# Patient Record
Sex: Female | Born: 1935 | Race: White | Hispanic: No | Marital: Married
Health system: Southern US, Community
[De-identification: ages and names within clinical notes are randomized; demographics above are authoritative.]

---

## 1997-11-10 ENCOUNTER — Ambulatory Visit: Admission: RE | Admit: 1997-11-10 | Discharge: 1997-11-10 | Payer: Self-pay | Admitting: Internal Medicine

## 2001-02-11 ENCOUNTER — Other Ambulatory Visit: Admission: RE | Admit: 2001-02-11 | Discharge: 2001-02-11 | Payer: Self-pay | Admitting: *Deleted

## 2003-06-23 ENCOUNTER — Inpatient Hospital Stay (HOSPITAL_COMMUNITY): Admission: RE | Admit: 2003-06-23 | Discharge: 2003-06-30 | Payer: Self-pay | Admitting: General Surgery

## 2003-06-23 ENCOUNTER — Encounter (INDEPENDENT_AMBULATORY_CARE_PROVIDER_SITE_OTHER): Payer: Self-pay | Admitting: Specialist

## 2004-08-05 ENCOUNTER — Ambulatory Visit: Payer: Self-pay | Admitting: Internal Medicine

## 2004-08-28 ENCOUNTER — Ambulatory Visit: Payer: Self-pay | Admitting: Internal Medicine

## 2004-10-21 ENCOUNTER — Other Ambulatory Visit: Admission: RE | Admit: 2004-10-21 | Discharge: 2004-10-21 | Payer: Self-pay | Admitting: Obstetrics and Gynecology

## 2005-06-25 ENCOUNTER — Encounter: Admission: RE | Admit: 2005-06-25 | Discharge: 2005-06-25 | Payer: Self-pay | Admitting: Internal Medicine

## 2005-07-12 IMAGING — CR DG ABDOMEN 2V
2 series · 2 of 2 positions shown · non-contrast
Comparison: 06/26/03.

CLINICAL DATA: Villous adenoma.  Small bowel ileus.
 ABDOMEN, TWO VIEWS 06/27/03

[view not recorded (1 of 2)]
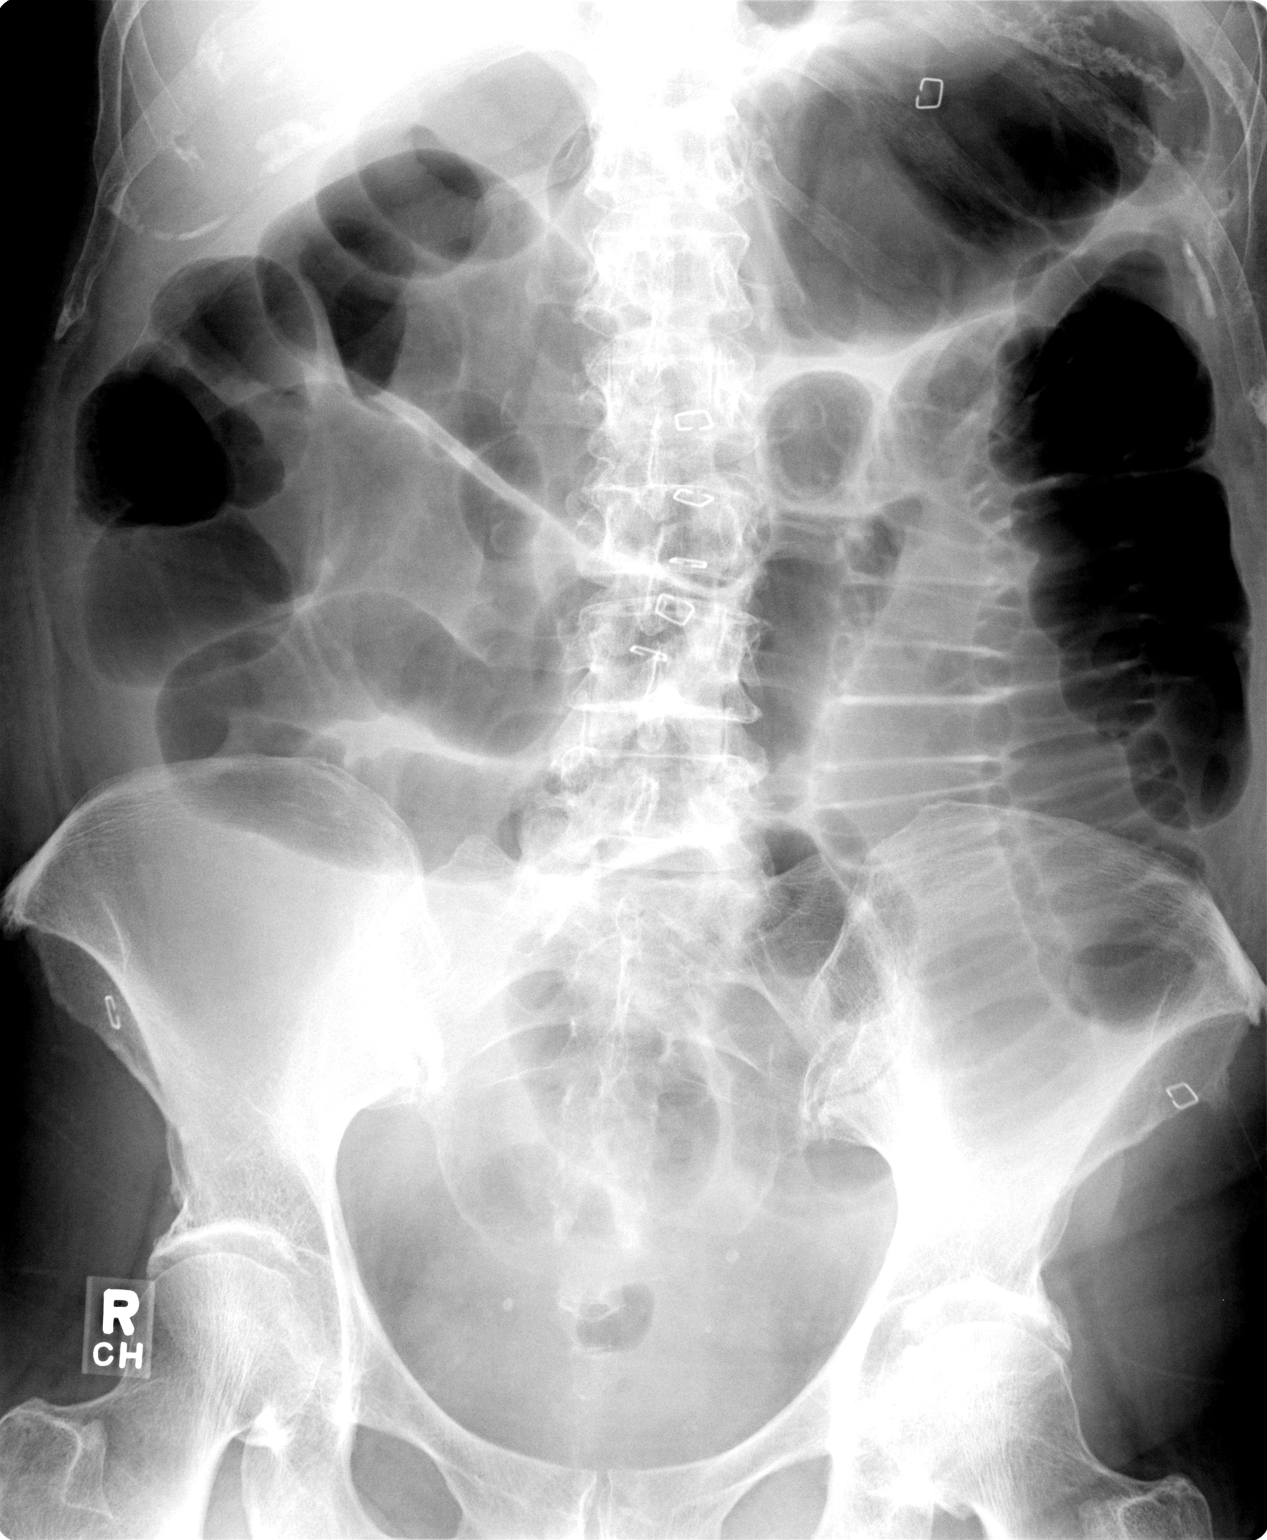

[view not recorded (2 of 2)]
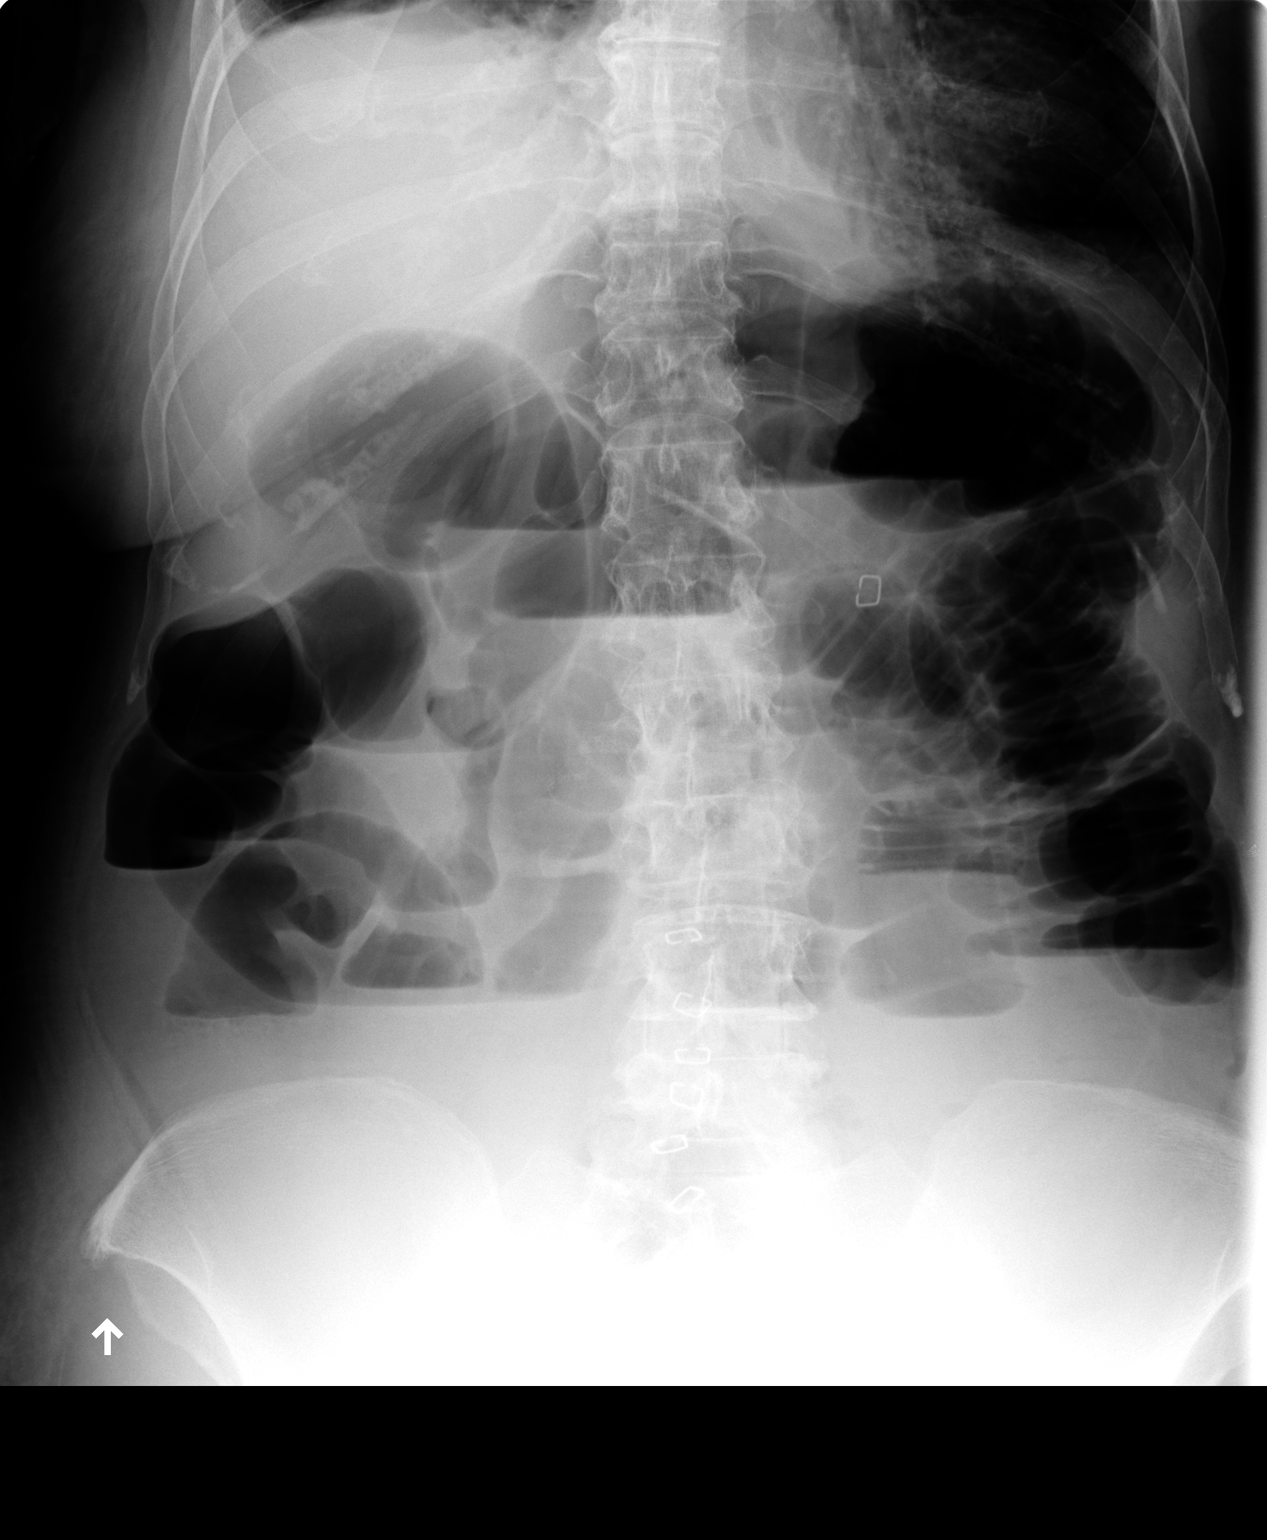

[2 of 2 positions shown; findings below may reference images not displayed]

Again noted is diffuse bowel dilatation involving both small bowel and colon, predominantly up to the splenic flexure.  The degree of dilatation is stable to slightly increased since the prior study.  It is difficult to completely exclude obstruction. 
 IMPRESSION
 Continued small bowel and colonic dilatation, stable to slightly worsened.  Difficult to exclude obstruction.

## 2005-10-15 ENCOUNTER — Encounter: Admission: RE | Admit: 2005-10-15 | Discharge: 2005-10-15 | Payer: Self-pay | Admitting: Obstetrics and Gynecology

## 2005-10-21 ENCOUNTER — Ambulatory Visit: Payer: Self-pay | Admitting: Internal Medicine

## 2006-01-15 ENCOUNTER — Ambulatory Visit: Admission: RE | Admit: 2006-01-15 | Discharge: 2006-05-13 | Payer: Self-pay | Admitting: *Deleted

## 2006-04-10 ENCOUNTER — Ambulatory Visit: Admission: RE | Admit: 2006-04-10 | Discharge: 2006-05-13 | Payer: Self-pay | Admitting: *Deleted

## 2007-09-07 ENCOUNTER — Ambulatory Visit: Payer: Self-pay | Admitting: Internal Medicine

## 2007-09-21 ENCOUNTER — Ambulatory Visit: Payer: Self-pay | Admitting: Internal Medicine

## 2008-07-21 ENCOUNTER — Encounter: Admission: RE | Admit: 2008-07-21 | Discharge: 2008-07-21 | Payer: Self-pay

## 2009-07-17 ENCOUNTER — Encounter (HOSPITAL_COMMUNITY): Admission: RE | Admit: 2009-07-17 | Discharge: 2009-09-27 | Payer: Self-pay | Admitting: Internal Medicine

## 2009-10-14 ENCOUNTER — Encounter (HOSPITAL_COMMUNITY)
Admission: AD | Admit: 2009-10-14 | Discharge: 2010-01-04 | Payer: Self-pay | Source: Home / Self Care | Attending: Internal Medicine | Admitting: Internal Medicine

## 2009-12-04 ENCOUNTER — Emergency Department (HOSPITAL_COMMUNITY): Admission: EM | Admit: 2009-12-04 | Discharge: 2009-12-04 | Payer: Self-pay | Admitting: Emergency Medicine

## 2009-12-13 ENCOUNTER — Encounter (HOSPITAL_COMMUNITY)
Admission: RE | Admit: 2009-12-13 | Discharge: 2010-02-05 | Payer: Self-pay | Source: Home / Self Care | Attending: Internal Medicine | Admitting: Internal Medicine

## 2010-03-19 LAB — URINALYSIS, MICROSCOPIC ONLY
Bilirubin Urine: NEGATIVE
Glucose, UA: NEGATIVE mg/dL
Ketones, ur: NEGATIVE mg/dL
pH: 6.5 (ref 5.0–8.0)

## 2010-03-19 LAB — CROSSMATCH

## 2010-03-19 LAB — TRANSFUSION REACTION

## 2010-03-20 LAB — CROSSMATCH: ABO/RH(D): O POS

## 2010-03-22 LAB — CROSSMATCH

## 2010-03-24 LAB — CROSSMATCH: Antibody Screen: NEGATIVE

## 2010-03-24 LAB — ABO/RH: ABO/RH(D): O POS

## 2010-04-07 DEATH — deceased

## 2010-05-24 NOTE — Op Note (Signed)
NAME:  Marissa Dean, Marissa Dean                          ACCOUNT NO.:  [id number]   MEDICAL RECORD NO.:  [id number]                   PATIENT TYPE:  INP   LOCATION:  0003                                 FACILITY:  [facility]   PHYSICIAN:  Sharlet Salina T. Hoxworth, M.D.          DATE OF BIRTH:  [birth year]   DATE OF PROCEDURE:  06/23/2003  DATE OF DISCHARGE:                                 OPERATIVE REPORT   PREOPERATIVE DIAGNOSIS:  Villous adenoma of the right colon.   POSTOPERATIVE DIAGNOSIS:  Villous adenoma of the right colon.   SURGICAL PROCEDURE:  Laparoscopic-assisted right hemicolectomy.   SURGEON:  Dr. Johna Sheriff   ASSISTANT:  Dr. Jerelene Redden   ANESTHESIA:  General.   BRIEF HISTORY:  Honore Wipperfurth is a 77 year old white female, who on a recent  routine screening colonoscopy was found to have a large, flat polypoid  lesion involving the proximal transverse colon, hepatic flexure area.  Biopsies revealed villous adenoma.  Partial colectomy has been recommended  and accepted.  The nature of the procedure, indications, risks of bleeding,  infection, possible need for open procedure were discussed and understood.  She is now brought to the operating room for this procedure.   DESCRIPTION OF OPERATION:  The patient brought to the operating room, placed  in supine position on the operating table, and general endotracheal  anesthesia was induced.  She had received mechanical antibiotic bowel prep  at home.  Antibiotics were given intravenously.  The abdomen was widely  sterilely prepped and draped.  Access was obtained through a 1 cm incision  just above the umbilicus, dissection carried down to the midline fascia  which was incised for 1 cm.  The peritoneum entered under direct vision.  The Hasson trocar was placed through a mattress suture and pneumoperitoneum  established.  Laparoscopy revealed fairly extensive omental adhesions into  the pelvis and anterior abdominal wall from previous  hysterectomy.  Then 5  mm trocars were placed in the left epigastrium and left lower quadrant.  Working through these sites with the harmonic scalpel, the omental adhesions  were completely lysed from the anterior abdominal wall and the omentum  mobilized above the pelvis.  The cecum, right colon, transverse colon  appeared normal.  The area had been reportedly tattooed, but there was no  evidence of this externally.  An additional 5 mm port was placed in the  right lower quadrant.  The right colon was then extensively mobilized,  dividing peritoneal attachments lateral to the cecum and right colon,  mobilizing this up out of the retroperitoneum.  The terminal ileum was  mobilized as well.  The lesser sac was entered at the lesser omentum in the  mid transverse colon.  Lesser omentum was then divided back toward the  hepatic flexure, and then the hepatic flexure was completely mobilized,  dividing the hepatocolic ligament with the harmonic scalpel.  Blunt  dissection was further used to  mobilize the entire right colon completely  back toward the midline.  After full mobilization, the Hasson trocar was  removed, and the mid abdomen incision was extended to 7 cm in length  periumbilically, dissection carried down through subcutaneous tissue and  fascia with cautery.  The protractor wound protector was placed.  The  terminal ileum, right colon, and transverse colon all then easily came up  out through this incision with very nice exposure of the mesentery and very  good mobility.  There was a mass palpable in the proximal transverse colon.  A resection 7-8 cm distal to this was planned in the mid transverse colon.  The omentum was divided at this point to take some with the specimen, tying  with 2-0 silk ties.  The mesentery of the terminal ileum, cecum, right  colon, proximal, and transverse colon was then sequentially divided between  clamps and tied with 2-0 silk ties, an additional suture  ligature or extra  tie placed for larger vessels.  After complete division of the mesentery, a  functional end-to-end anastomosis was created between the terminal ileum and  transverse colon with a single firing of the EndoGIA 75 mm stapler.  The  common enterotomy was closed and the specimen removed with a single firing  of the TA 60 stapler.  The specimen was sent for gross exam which reportedly  contained a 7 cm villous tumor with margins grossly widely negative.  The  mesenteric defect was closed with interrupted 2-0 silks.  The bowel was  returned to its anatomic position in the abdomen.  The midline wound was  closed with running #1 PDS, beginning at either end of the incision and tied  centrally.  CO2 pressure was then applied and the 5 mm scope placed.  The  abdomen was irrigated and complete hemostasis assured.  Trocars were then  removed and all CO2 evacuated from the peritoneal cavity.  Skin incisions  were closed with staples.  Sponge, needle, and instrument counts were  correct.  Dry sterile dressings were applied and the patient taken to  recovery in good condition.                                               Lorne Skeens. Hoxworth, M.D.    Tory Emerald  D:  06/23/2003  T:  06/23/2003  Job:  [id number]

## 2010-05-24 NOTE — Assessment & Plan Note (Signed)
[facility]                           GASTROENTEROLOGY OFFICE NOTE   Marissa Dean, Marissa Dean                       MRN:          [id number]  DATE:10/21/2005                            DOB:          1928-07-01    REFERRING PHYSICIAN:  Artist Pais, M.D.   REASON FOR CONSULTATION:  Recently diagnosed clear cell carcinoma and  abnormal CT scan.   HISTORY:  This is a 77 year old white female with a history of hypertension,  large villous adenoma of the proximal colon status post right hemicolectomy  in 2005, and left breast cancer who was referred through the courtesy of Dr.  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  As mentioned, the patient underwent  right hemicolectomy for a large villous adenoma of the proximal colon.  This  was found to have high grade dysplasia.  Follow-up surveillance colonoscopy  on August 28, 2004, was normal postoperatively.  Follow up in 3 years  recommended.  The patient did well until recent months when she has had some  intermittent lower abdominal discomfort.  She saw Dr. Thomasena Edis in June with  episodic vaginal bleeding and again this month with complaints of vaginal  bleeding and vaginal odor.  Two small lesions of the vagina were noted, one  of which was fulgurated and the other friable and biopsied.  This was  performed on October 09, 2005, and returned showing vaginal mucosa with clear  cell adenocarcinoma.  I spoke to the pathologist, Marissa Dean. Marissa Dean, M.D.  this afternoon.  He felt this was likely metastatic and of gynecologic  origin.  He did not feel this was typical for renal cell cancer and would be  extremely inconsistent with a primary GI cancer.  The patient did undergo a  CT scan of the abdomen and pelvis on October 15, 2005.  There was no  evidence of primary malignancy within the abdomen.  The most striking  finding, however, was that of a  10.9 cm partially cystic, partially solid  mass that was felt most likely to arise from the right ovary.  The mass was  inseparable from the vaginal cuff.  There was mild dilation of the right  ureter.  The mass was adjacent to the sigmoid colon, though, without  definite involvement.  The patient is accompanied today by her husband and  aside from some lower abdominal and pelvic discomfort is doing well.  She  denies nausea and vomiting, change in appetite, weight loss, change in bowel  habits, or rectal bleeding.   ALLERGIES:  No known drug allergies.   CURRENT MEDICATIONS:  1. Zestoretic unspecified dosage once daily.  2. Fosamax unspecified dosage once weekly.  3. Aspirin 81 mg daily.   PAST MEDICAL HISTORY:  1. Hypertension.  2. Villous adenoma of the proximal colon status post right hemicolectomy.  3. Left breast cancer status post mastectomy.  4. Status post hysterectomy.  5. History of endometriosis with abdominal surgery x2.  6. Status post tonsillectomy.  7. History of sleep apnea.   FAMILY  HISTORY:  Negative for gastrointestinal malignancy.   SOCIAL HISTORY:  The patient is married.  Accompanied by her husband.  Occasionally uses alcohol.  Does not smoke.   PHYSICAL EXAMINATION:  GENERAL:  Concerned, but otherwise well-appearing  female in no acute distress.  VITAL SIGNS:  Blood pressure 148/74, heart rate 80 and regular, weight 164.4  pounds (no change from May of 2005).  HEENT:  Sclerae anicteric, conjunctivae pink, oral mucosa intact, no  adenopathy.  LUNGS:  Clear.  HEART:  Regular.  ABDOMEN:  Soft with tenderness to palpation in the lower abdomen.  Most  prominent just right at the suprapubic region with fullness consistent with  mass noted.  Good bowel sounds heard.  EXTREMITIES:  Without edema.   IMPRESSION:  This is a 77 year old female with lower abdominal discomfort  and recent vaginal symptoms who was found to have clear cell carcinoma on  vaginal  inspection.  Subsequent workup reveals a large mass in the right  adnexal area consistent with a gynecologic primary.  Nothing to suggest  gastrointestinal primary cancer.  Negative colonoscopy post resection last  year.   RECOMMENDATIONS:  At this point the patient will not require any additional  GI evaluation.  She will return to the care of Dr. Thomasena Edis who can assist in  the patient's next management decision, which will likely be surgery.            ______________________________  Wilhemina Bonito. Eda Keys., MD      JNP/MedQ  DD:  10/21/2005  DT:  10/22/2005  Job #:  [id number]   cc:   Artist Pais, M.D.  Gwen Pounds, MD  Lorne Skeens. Hoxworth, M.D.

## 2010-05-24 NOTE — Discharge Summary (Signed)
NAME:  Marissa Dean, Marissa Dean                          ACCOUNT NO.:  [id number]   MEDICAL RECORD NO.:  [id number]                   PATIENT TYPE:  INP   LOCATION:  0469                                 FACILITY:  [facility]   PHYSICIAN:  Sharlet Salina T. Hoxworth, M.D.          DATE OF BIRTH:  Jun 21, 1928   DATE OF ADMISSION:  06/23/2003  DATE OF DISCHARGE:  06/30/2003                                 DISCHARGE SUMMARY   DISCHARGE DIAGNOSIS:  Villous adenoma with high-grade dysplasia of the right  colon.   OPERATION/PROCEDURE:  Laparoscopic-assisted right hemicolectomy on June 23, 2003.   HISTORY OF PRESENT ILLNESS:  This is a 77 year old white female who  underwent a recent screening colonoscopy by Dr. Yancey Flemings. This revealed a  bulky lesion in the right colon with biopsy showing tubulovillous adenoma.  She has had no GI symptoms or bleeding.  With this finding, right  hemicolectomy, laparoscopic-assisted has been recommended and she was  admitted for this procedure.   PAST MEDICAL HISTORY:  History of breast cancer treated in 1997. She is also  followed for sleep apnea, hypertension, and endometriosis. She has had  hysterectomy, left mastectomy, sentinel node biopsy, and tonsillectomy.   CURRENT MEDICATIONS:  Zestoretic and Fosamax.   ALLERGIES:  No known drug allergies.   For social history, family history, and review of systems, please see  detailed H&P.   PERTINENT PHYSICAL EXAMINATION:  GENERAL: A well-developed, white female in  no acute distress.  VITAL SIGNS: Unremarkable.  ABDOMEN: Soft and nontender. No masses or organomegaly.   HOSPITAL COURSE:  Following a mechanical antibiotic and bowel prep, the  patient was admitted on the morning of procedure and underwent an uneventful  laparoscopic-assisted right hemicolectomy. She tolerated the procedure well.  Final pathology revealed tubulovillous adenoma with multiple areas of high-  grade dysplasia, but no invasive carcinoma. She was  started on a clear  liquid diet on the first postoperative day. She did however have some  abdominal nausea, bloating, nausea, and occasional vomiting over the next  two to three days. Abdominal x-rays showed apparent ileus. IV fluids were  maintained and she was continued on sips of liquids. Laboratory evaluation  was unremarkable. She did however develop some hypokalemia noted on the 5th  postoperative day requiring a significant amount of IV replacement. By June 29, 2003, the sixth postoperative day, she had had several bowel movements  and was tolerating full liquids well. She still felt a little bit weak and  discharge was planned for June 30, 2003. At this time, she again had a  little crampy pain in the morning, but by afternoon had had further bowel  movements, was tolerating a diet, was ambulatory and felt well. The abdomen  was soft, nondistended, and the wound was well healed. She was discharged  home on June 30, 2003.   DISCHARGE MEDICATIONS:  The same as admission, plus Vicodin and Tylenol as  needed for pain.   FOLLOWUP:  In my office in two weeks.                                               Lorne Skeens. Hoxworth, M.D.    Tory Emerald  D:  07/12/2003  T:  07/12/2003  Job:  [id number]

## 2011-12-20 IMAGING — CR DG SHOULDER 2+V*L*
3 series · 3 of 3 positions shown · non-contrast
Comparison: None.

CLINICAL DATA: , fall, shoulder pain

LEFT SHOULDER - 2+ VIEW

[view not recorded (1 of 3)]
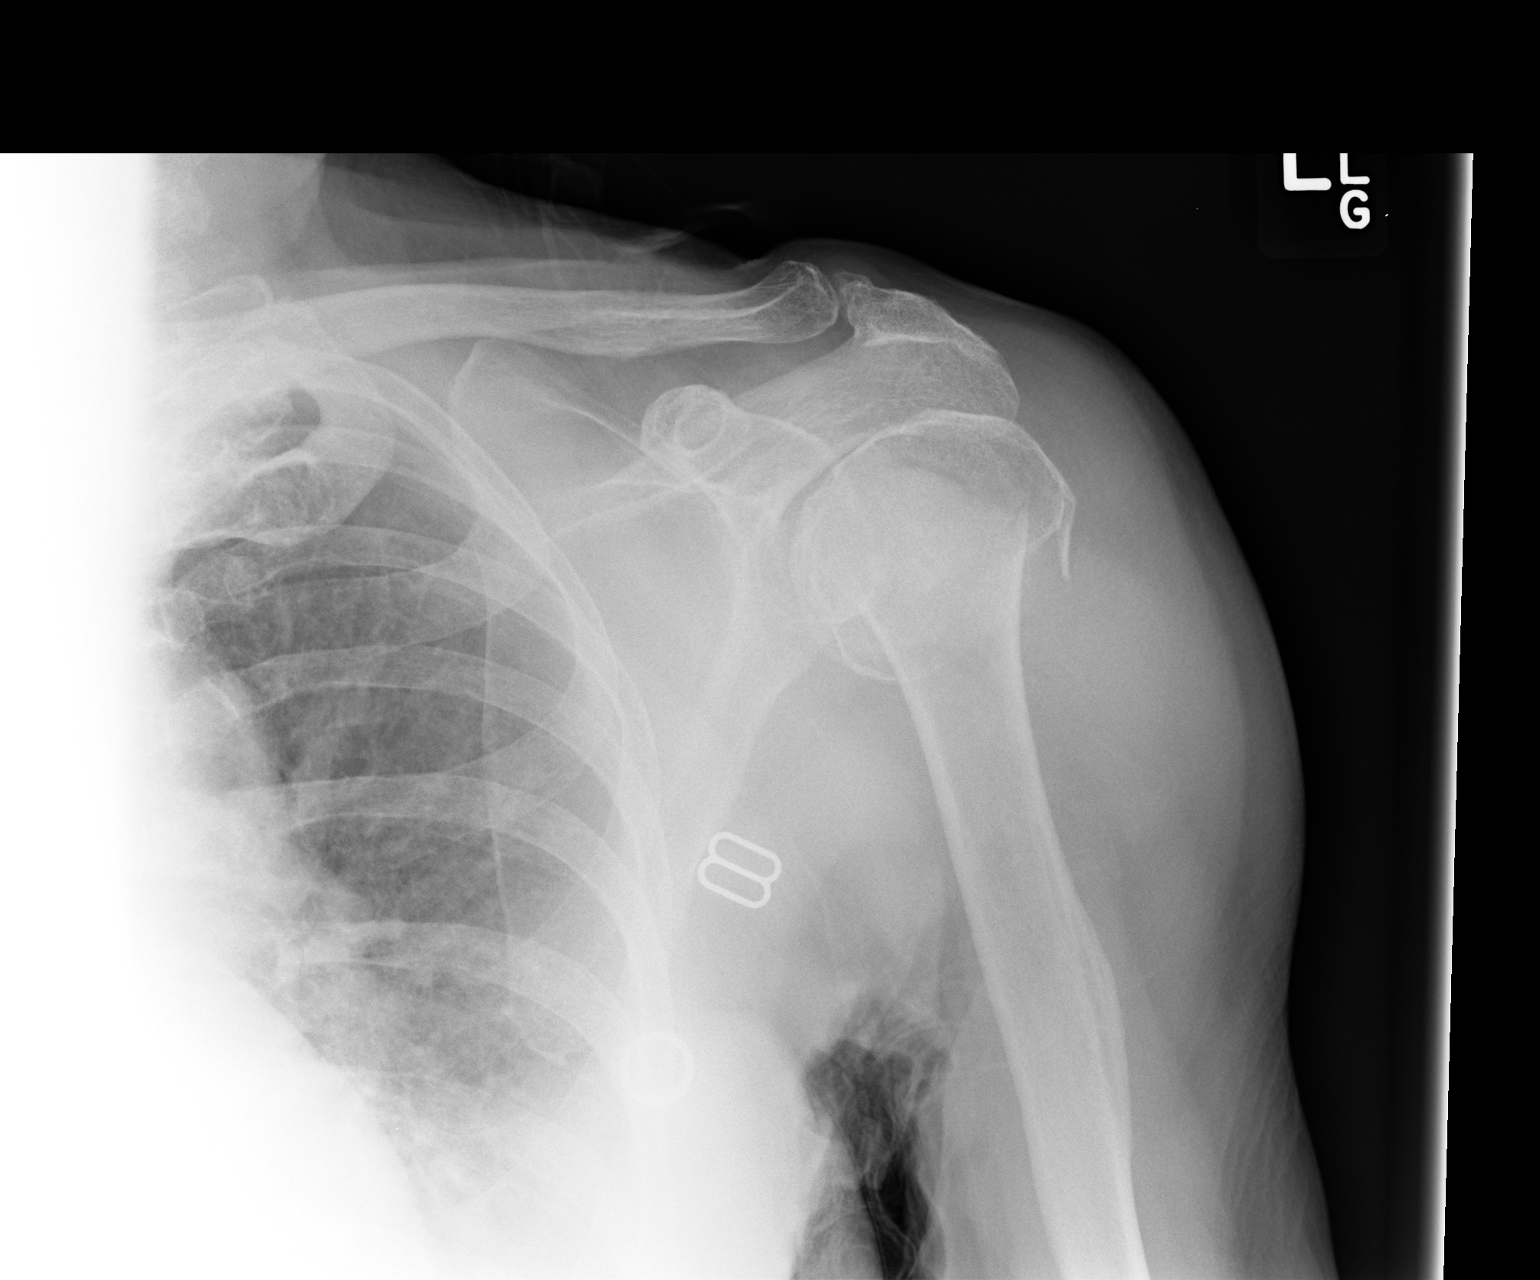

[view not recorded (2 of 3)]
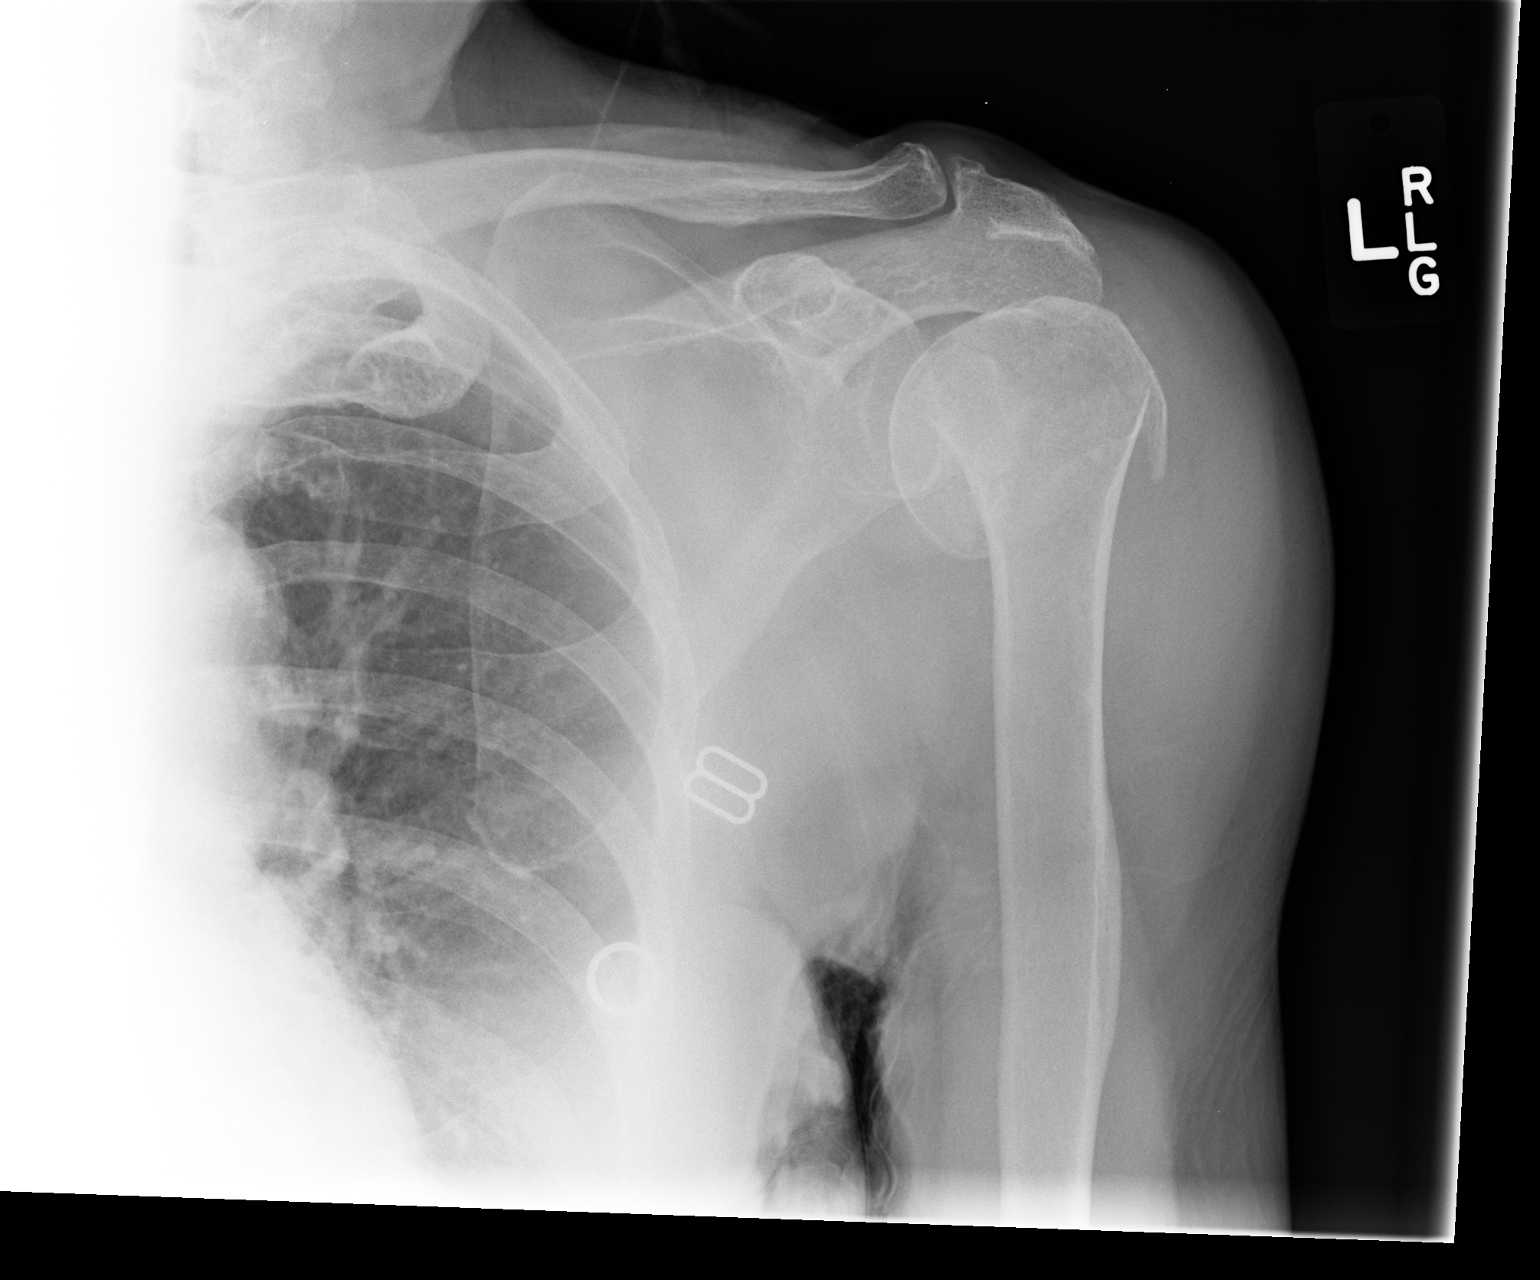

[view not recorded (3 of 3)]
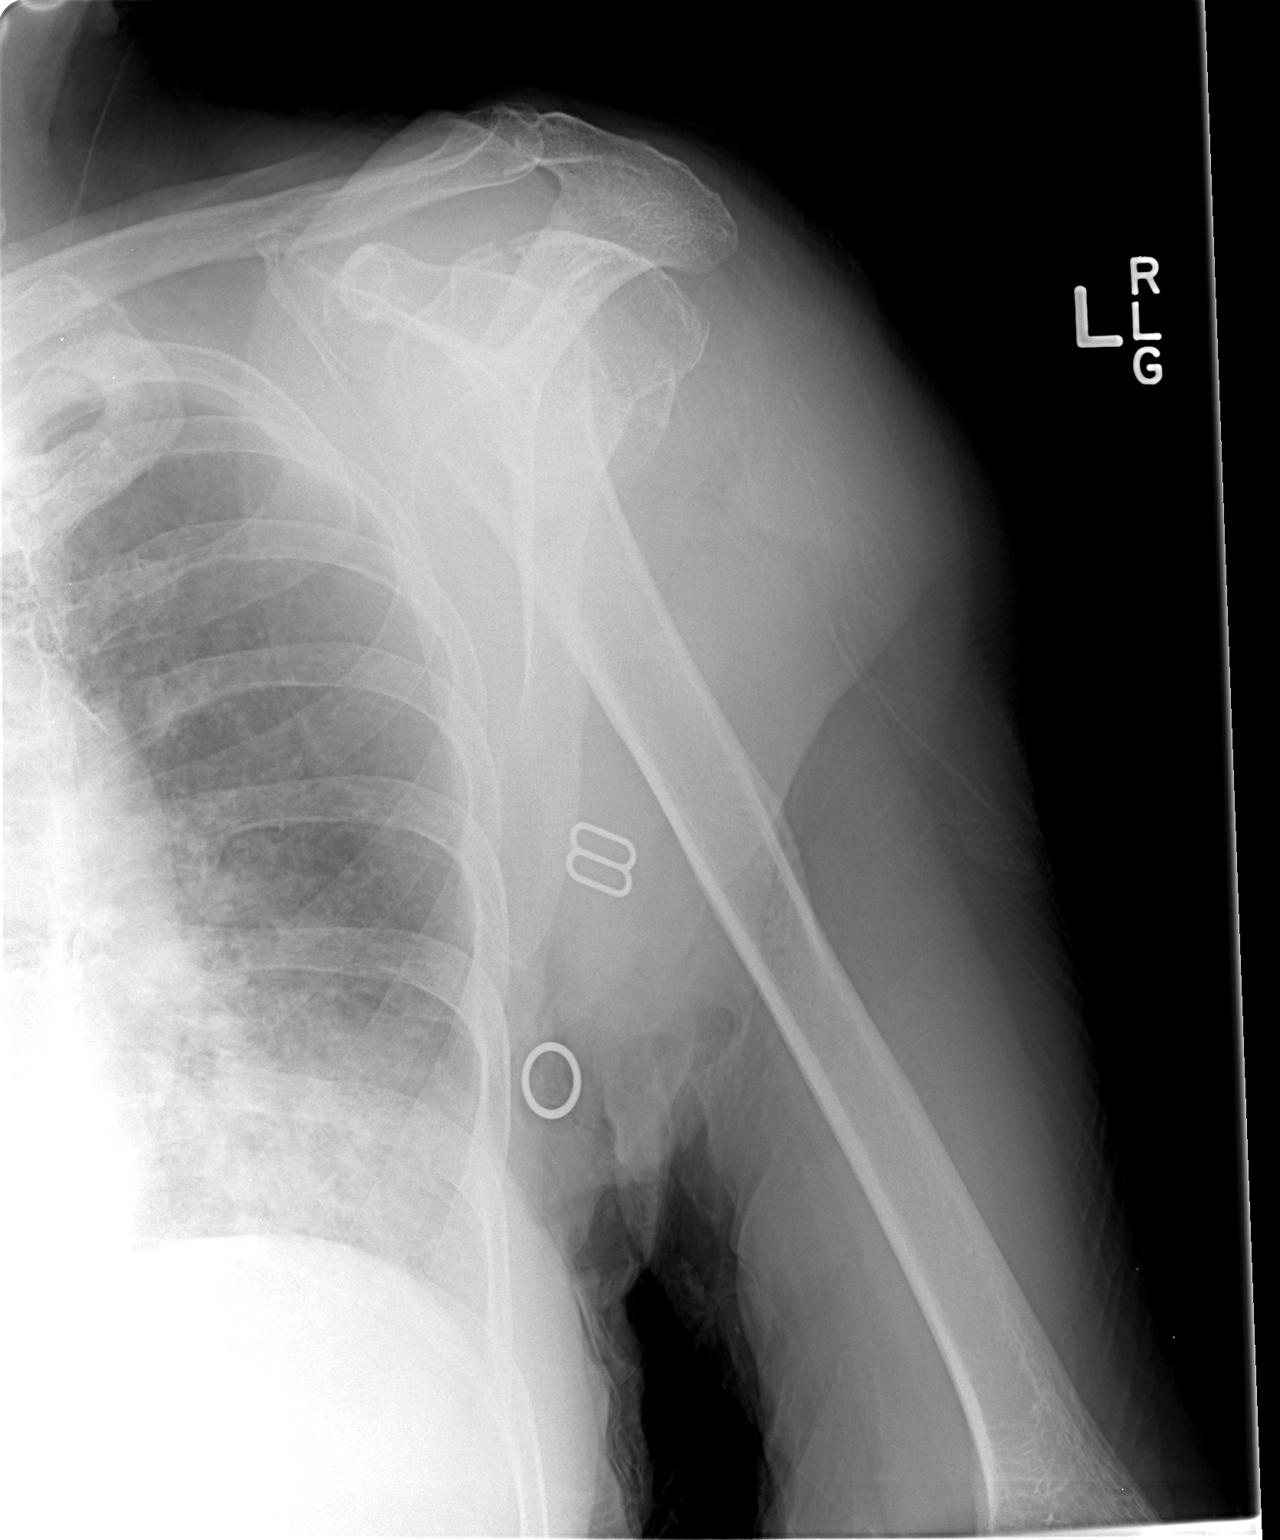

[3 of 3 positions shown; findings below may reference images not displayed]

FINDINGS: Three views of the left shoulder submitted.  There is
mild displaced fracture of the left proximal humerus.
IMPRESSION: Fracture of the left proximal humerus with mild displacement of the
left humeral head.
